# Patient Record
Sex: Male | Born: 1950 | Race: White | Hispanic: No | Marital: Single | Smoking: Never smoker
Health system: Southern US, Community
[De-identification: ages and names within clinical notes are randomized; demographics above are authoritative.]

## PROBLEM LIST (undated history)

## (undated) HISTORY — DX: Morbid (severe) obesity due to excess calories: E66.01

---

## 2010-01-16 ENCOUNTER — Emergency Department: Payer: Self-pay | Admitting: Emergency Medicine

## 2019-09-08 ENCOUNTER — Other Ambulatory Visit: Payer: Self-pay

## 2019-09-08 ENCOUNTER — Ambulatory Visit (INDEPENDENT_AMBULATORY_CARE_PROVIDER_SITE_OTHER): Payer: Medicare Other

## 2019-09-08 ENCOUNTER — Encounter: Payer: Self-pay | Admitting: Emergency Medicine

## 2019-09-08 ENCOUNTER — Ambulatory Visit
Admission: EM | Admit: 2019-09-08 | Discharge: 2019-09-08 | Disposition: A | Discharge status: Home / Self Care | Payer: Medicare Other | Attending: Family Medicine | Admitting: Family Medicine

## 2019-09-08 DIAGNOSIS — Z20822 Contact with and (suspected) exposure to covid-19: Secondary | ICD-10-CM

## 2019-09-08 DIAGNOSIS — R05 Cough: Secondary | ICD-10-CM | POA: Diagnosis not present

## 2019-09-08 DIAGNOSIS — R509 Fever, unspecified: Secondary | ICD-10-CM | POA: Diagnosis not present

## 2019-09-08 DIAGNOSIS — U071 COVID-19: Secondary | ICD-10-CM | POA: Diagnosis not present

## 2019-09-08 DIAGNOSIS — J189 Pneumonia, unspecified organism: Secondary | ICD-10-CM

## 2019-09-08 LAB — SARS CORONAVIRUS 2 (TAT 6-24 HRS): SARS Coronavirus 2: POSITIVE — AB

## 2019-09-08 MED ORDER — BENZONATATE 200 MG PO CAPS: 200.0000 mg | ORAL_CAPSULE | Freq: Three times a day (TID) | ORAL | 0 refills | Status: AC | PRN

## 2019-09-08 MED ORDER — ACETAMINOPHEN 500 MG PO TABS
1000.0000 mg | ORAL_TABLET | Freq: Once | ORAL | Status: AC
  Administered 2019-09-08: 1000 mg via ORAL

## 2019-09-08 MED ORDER — ALBUTEROL SULFATE HFA 108 (90 BASE) MCG/ACT IN AERS: 1.0000 | INHALATION_SPRAY | Freq: Four times a day (QID) | RESPIRATORY_TRACT | 0 refills | Status: AC | PRN

## 2019-09-08 NOTE — ED Provider Notes (Signed)
[facility]    CSN: [id number] Arrival date & time: 09/08/19  1046      History   Chief Complaint Chief Complaint  Patient presents with   Cough   HPI  69 year old male presents with ongoing cough.  Patient reports that he has had a nagging cough since November of last year.  Patient reports that recently he has had shortness of breath.  He reports associated fatigue.  He is unsure of how long this has been going on.  Patient reports that he has not had any documented fever at home.  He states that he does have times where he is sweating and feels hot and subsequently takes Advil and this improves.  He states that he was encouraged by his family members to come in for evaluation.  He denies any direct exposure to COVID-19.  He took a home test for COVID-19 yesterday and it was negative.  No other reported symptoms.  No other complaints.  Home Medications    Prior to Admission medications   Medication Sig Start Date End Date Taking? Authorizing Provider  albuterol (VENTOLIN HFA) 108 (90 Base) MCG/ACT inhaler Inhale 1-2 puffs into the lungs every 6 (six) hours as needed for wheezing or shortness of breath. 09/08/19   Tommie Sams, DO  benzonatate (TESSALON) 200 MG capsule Take 1 capsule (200 mg total) by mouth 3 (three) times daily as needed for cough. 09/08/19   Tommie Sams, DO   Social History Social History   Tobacco Use   Smoking status: Never Smoker   Smokeless tobacco: Never Used  Vaping Use   Vaping Use: Never used  Substance Use Topics   Alcohol use: Never   Drug use: Never     Allergies   Patient has no known allergies.   Review of Systems Review of Systems  Constitutional: Negative for fever.  Respiratory: Positive for cough and shortness of breath.    Physical Exam Triage Vital Signs ED Triage Vitals  Enc Vitals Group     BP 09/08/19 1137 (!) 153/66     Pulse Rate 09/08/19 1137 88     Resp 09/08/19 1137 (!) 22     Temp 09/08/19 1137  (!) 102.3 F (39.1 C)     Temp Source 09/08/19 1137 Oral     SpO2 09/08/19 1137 96 %     Weight 09/08/19 1140 300 lb (136.1 kg)     Height 09/08/19 1140 5\' 11"  (1.803 m)     Head Circumference --      Peak Flow --      Pain Score 09/08/19 1140 0     Pain Loc --      Pain Edu? --      Excl. in GC? --    Updated Vital Signs BP (!) 153/66 (BP Location: Right Arm)    Pulse 88    Temp (!) 102.3 F (39.1 C) (Oral)    Resp (!) 22    Ht 5\' 11"  (1.803 m)    Wt 136.1 kg    SpO2 96%    BMI 41.84 kg/m   Visual Acuity Right Eye Distance:   Left Eye Distance:   Bilateral Distance:    Right Eye Near:   Left Eye Near:    Bilateral Near:     Physical Exam Vitals and nursing note reviewed.  Constitutional:      Appearance: He is obese.     Comments: Appears fatigued but in no acute distress.  HENT:     Head: Normocephalic and atraumatic.  Eyes:     General:        Right eye: No discharge.        Left eye: No discharge.     Conjunctiva/sclera: Conjunctivae normal.  Cardiovascular:     Rate and Rhythm: Normal rate and regular rhythm.  Pulmonary:     Comments: Mild increased work of breathing.  Diffuse crackles noted. Neurological:     Mental Status: He is alert.  Psychiatric:        Mood and Affect: Mood normal.        Behavior: Behavior normal.    UC Treatments / Results  Labs (all labs ordered are listed, but only abnormal results are displayed) Labs Reviewed  SARS CORONAVIRUS 2 (TAT 6-24 HRS)    EKG   Radiology DG Chest 2 View  Result Date: 09/08/2019 CLINICAL DATA:  Cough, fever. EXAM: CHEST - 2 VIEW COMPARISON:  None. FINDINGS: The heart size and mediastinal contours are within normal limits. No pneumothorax or pleural effusion is noted. Small focal patchy airspace opacities are noted in the lungs which may represent multifocal pneumonia. The visualized skeletal structures are unremarkable. IMPRESSION: Small focal patchy bilateral airspace opacities are noted which may  represent multifocal pneumonia. Follow-up radiographs recommended. Electronically Signed   By: Lupita Raider M.D.   On: 09/08/2019 12:00    Procedures Procedures (including critical care time)  Medications Ordered in UC Medications  acetaminophen (TYLENOL) tablet 1,000 mg (1,000 mg Oral Given 09/08/19 1157)    Initial Impression / Assessment and Plan / UC Course  I have reviewed the triage vital signs and the nursing notes.  Pertinent labs & imaging results that were available during my care of the patient were reviewed by me and considered in my medical decision making (see chart for details).    69 year old male presents with cough and shortness of breath.  Patient currently febrile.  Chest x-ray obtained and was independently reviewed by me.  Patchy bilateral opacities consistent with multifocal pneumonia.  I suspect that patient has Covid pneumonia.  Albuterol as directed.  Tessalon Perles for cough.  Plan for monoclonal antibody infusion if patient tests positive.  Final Clinical Impressions(s) / UC Diagnoses   Final diagnoses:  Multifocal pneumonia  Suspected COVID-19 virus infection     Discharge Instructions     Medication as prescribed.  Check pulse ox everyday. If you are 93% or below, go to the ER.  Rest.  Stay home and stay away from others.  Take care  Dr. Adriana Simas    ED Prescriptions    Medication Sig Dispense Auth. Provider   albuterol (VENTOLIN HFA) 108 (90 Base) MCG/ACT inhaler Inhale 1-2 puffs into the lungs every 6 (six) hours as needed for wheezing or shortness of breath. 18 g Pershing Skidmore G, DO   benzonatate (TESSALON) 200 MG capsule Take 1 capsule (200 mg total) by mouth 3 (three) times daily as needed for cough. 30 capsule Tommie Sams, DO     PDMP not reviewed this encounter.   Tommie Sams, [place] 09/08/19 1801

## 2019-09-08 NOTE — ED Triage Notes (Addendum)
Patient c/o cough that he has had since November. He states recently he has had cold sweat and feels as if he has had a fever. He works outside and states he is becoming more fatigue and short of breath. He tested for COVID yesterday and was negative.

## 2019-09-08 NOTE — Discharge Instructions (Signed)
Medication as prescribed.  Check pulse ox everyday. If you are 93% or below, go to the ER.  Rest.  Stay home and stay away from others.  Take care  Dr. Adriana Simas

## 2019-09-09 ENCOUNTER — Telehealth: Payer: Self-pay | Admitting: Physician Assistant

## 2019-09-09 ENCOUNTER — Encounter: Payer: Self-pay | Admitting: Physician Assistant

## 2019-09-09 NOTE — Telephone Encounter (Signed)
Called to discuss with patient about Covid symptoms and the use of casirivimab/imdevimab, a monoclonal antibody infusion for those with mild to moderate Covid symptoms and at a high risk of hospitalization.  Pt is qualified for this infusion at the [facility] due to; Specific high risk criteria : Older age (>/= 68 yo) and BMI > 25. Sx onset 9/1. Could not get a hold of pt. I did call his wife and left a message with her. She said he would call us back. Gave my work cell and hotline # [phone].  Cline Crock PA-C  [facility]

## 2019-09-10 ENCOUNTER — Telehealth: Payer: Self-pay | Admitting: Infectious Diseases

## 2019-09-10 NOTE — Telephone Encounter (Signed)
Another attempt to get in touch with Albert Foster with regards to MAB infusion for covid infection  He has read our MyChart message.

## 2021-08-28 IMAGING — CR DG CHEST 2V
4 series · 4 of 4 positions shown · non-contrast
Comparison: None.

CLINICAL DATA: Cough, fever.

EXAM:
CHEST - 2 VIEW

[chest pa (1 of 2)]
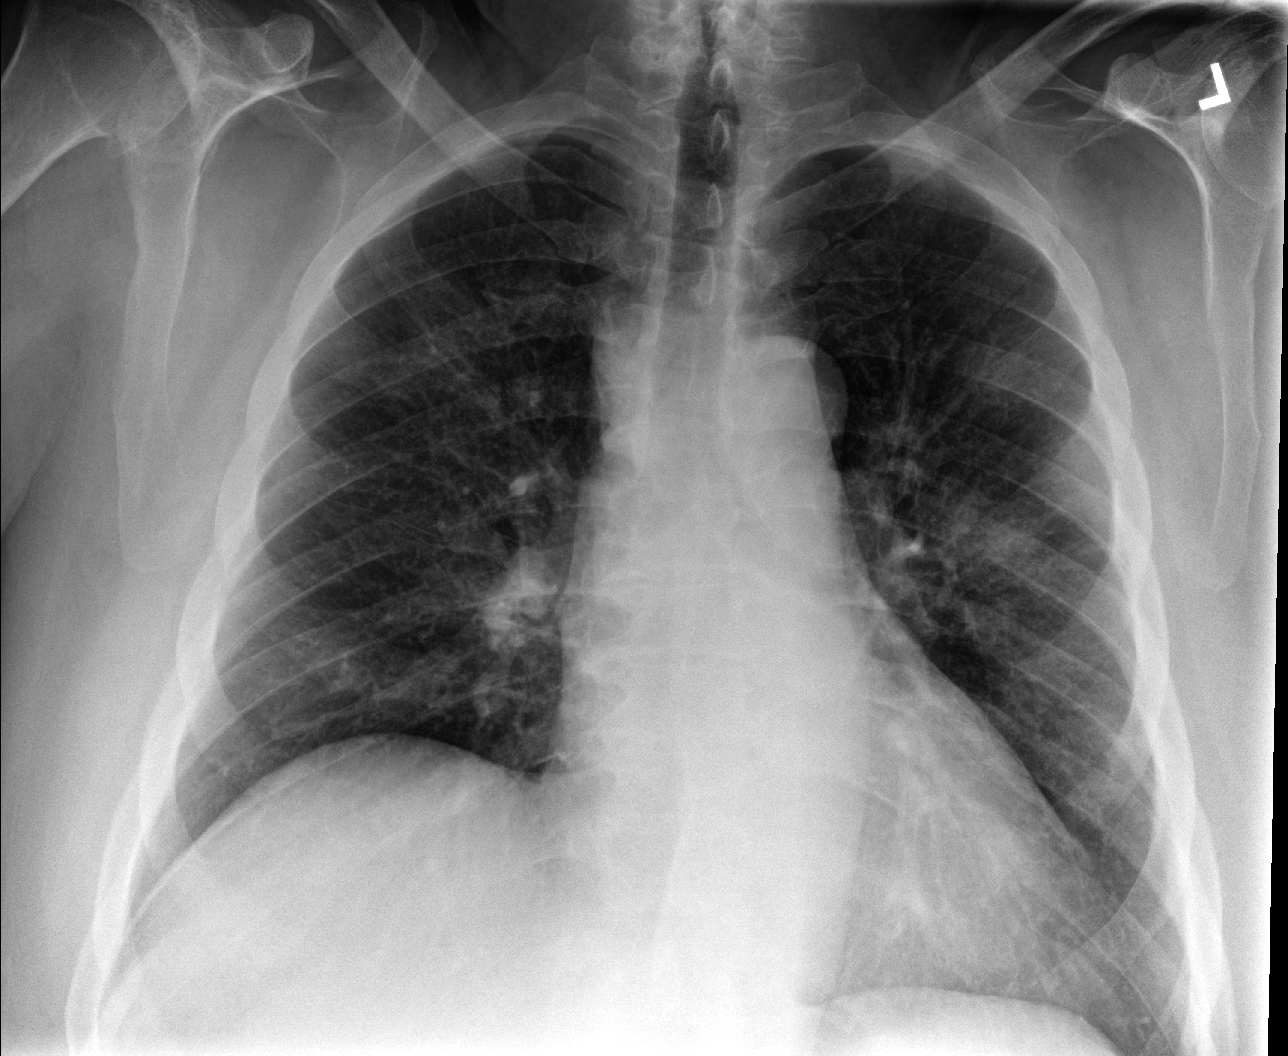

[chest lat (1 of 2)]
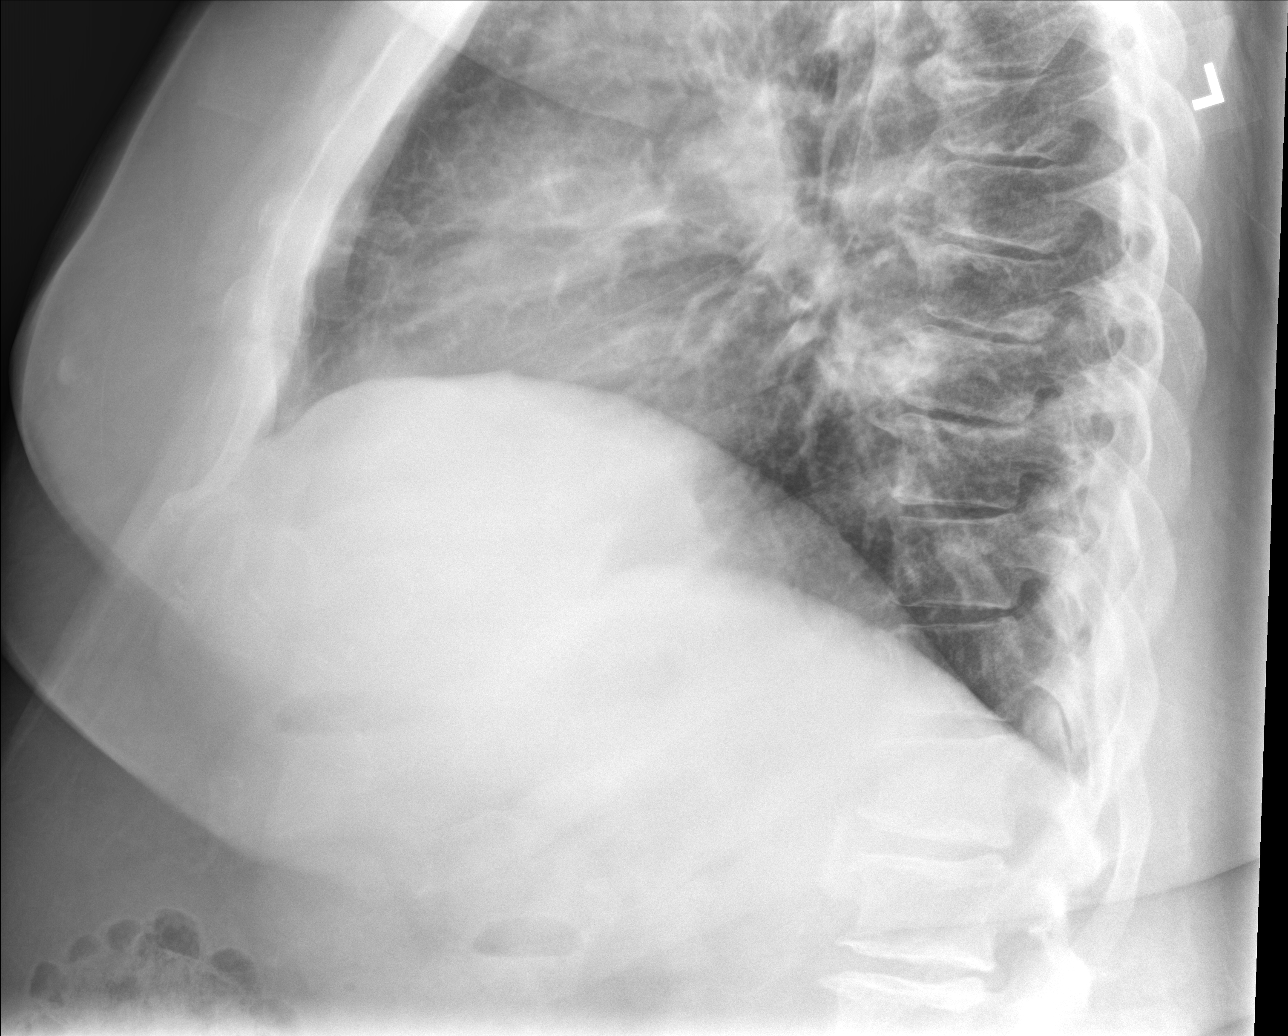

[chest pa (2 of 2)]
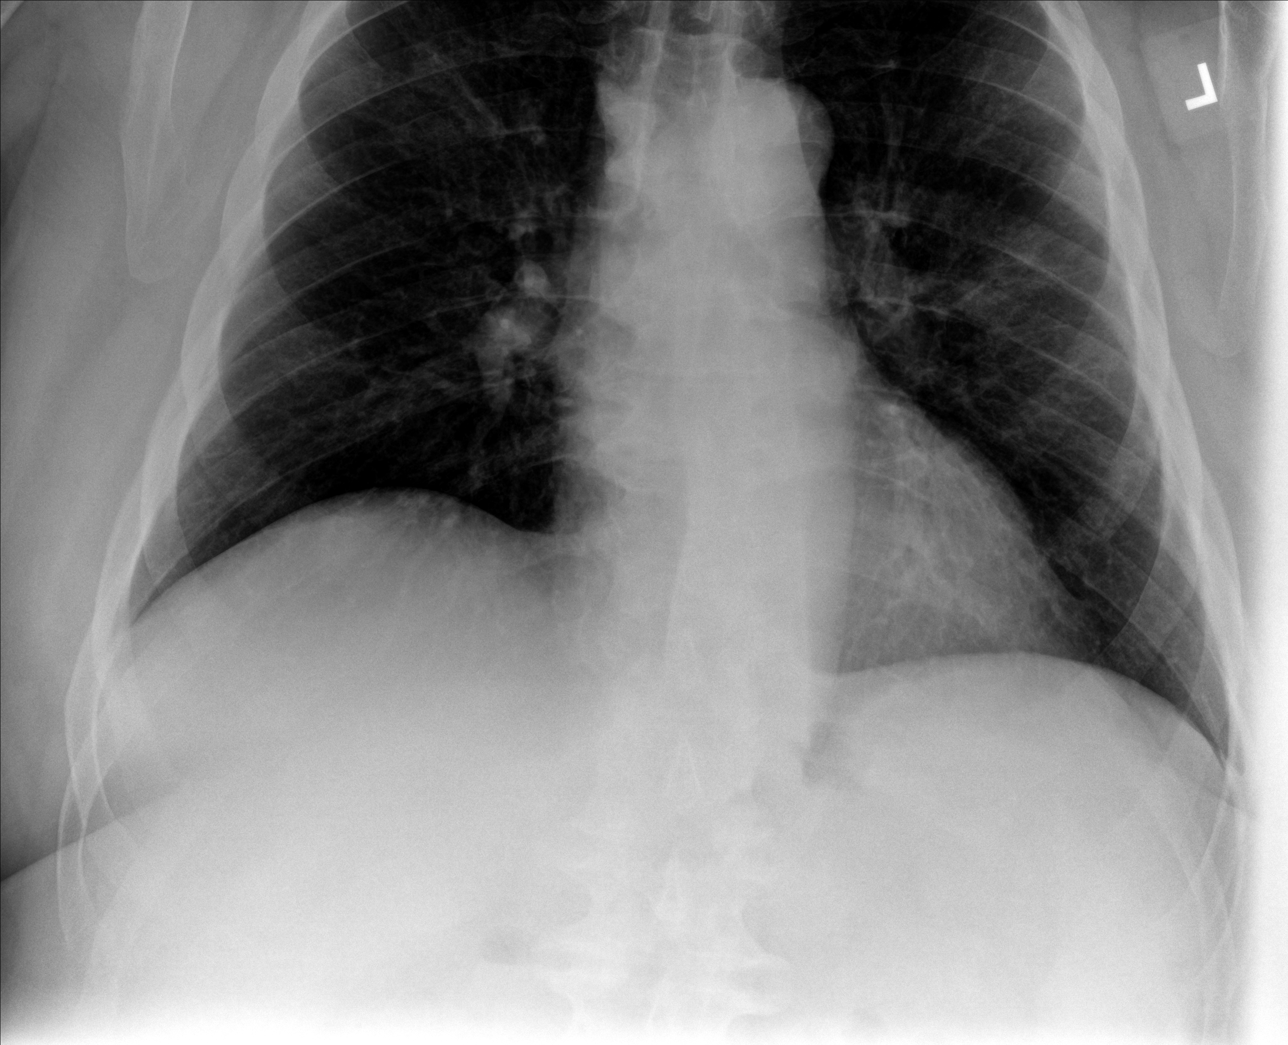

[chest lat (2 of 2)]
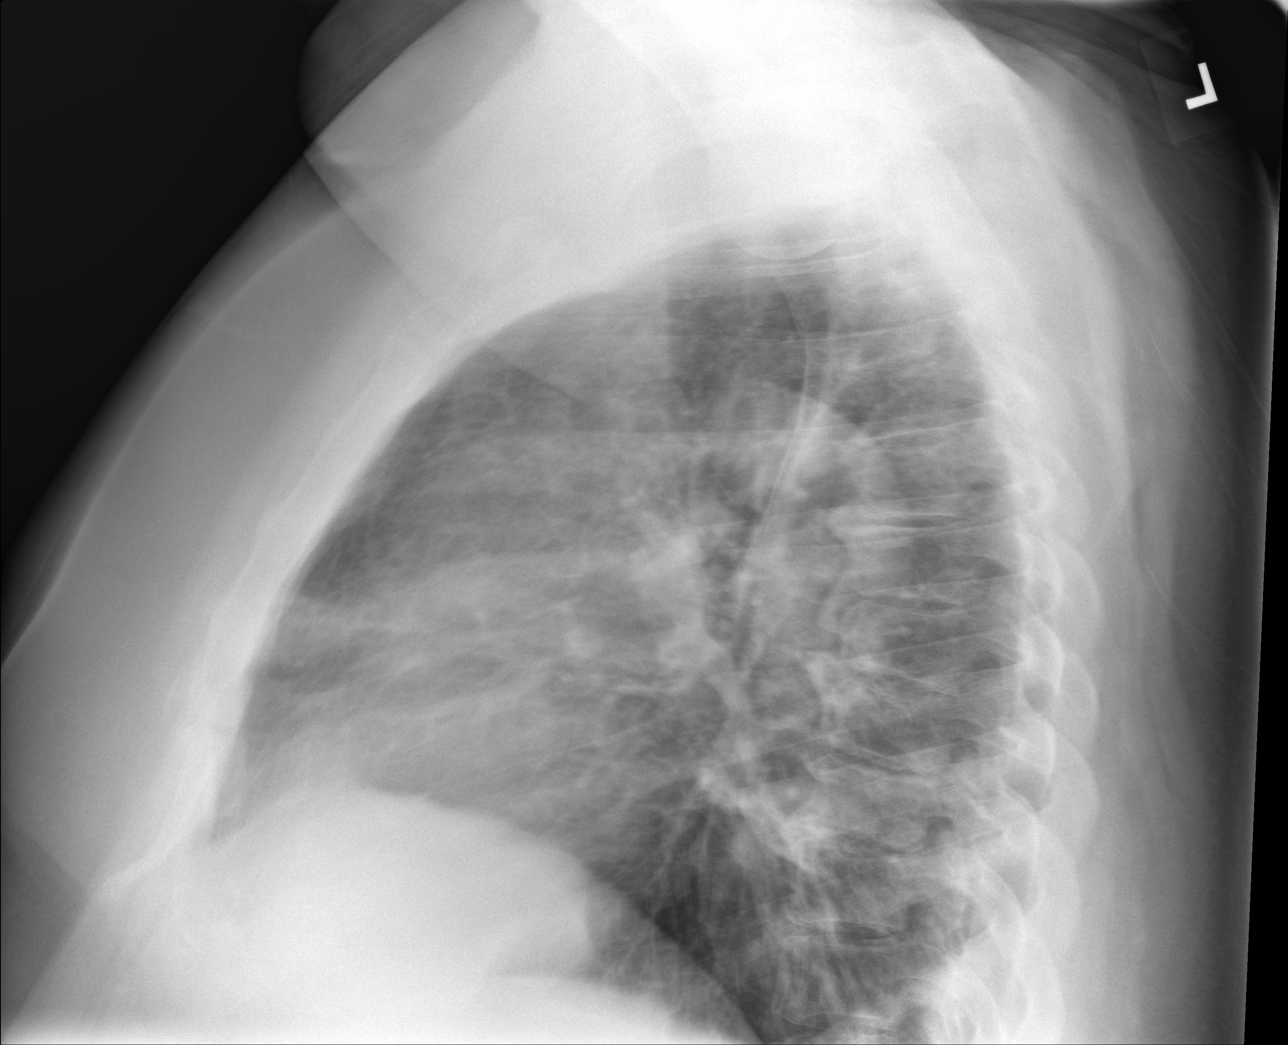

[4 of 4 positions shown; findings below may reference images not displayed]

FINDINGS: The heart size and mediastinal contours are within normal limits. No
pneumothorax or pleural effusion is noted. Small focal patchy
airspace opacities are noted in the lungs which may represent
multifocal pneumonia. The visualized skeletal structures are
unremarkable.
IMPRESSION: Small focal patchy bilateral airspace opacities are noted which may
represent multifocal pneumonia. Follow-up radiographs recommended.
